# Patient Record
Sex: Male | Born: 1975 | Race: Black or African American | Hispanic: No | Marital: Married | Smoking: Never smoker
Health system: Southern US, Community
[De-identification: ages and names within clinical notes are randomized; demographics above are authoritative.]

## PROBLEM LIST (undated history)

## (undated) DIAGNOSIS — Z789 Other specified health status: Secondary | ICD-10-CM

## (undated) HISTORY — PX: NO PAST SURGERIES: SHX2092

---

## 2005-05-03 ENCOUNTER — Emergency Department (HOSPITAL_COMMUNITY): Admission: EM | Admit: 2005-05-03 | Discharge: 2005-05-03 | Payer: Self-pay | Admitting: Emergency Medicine

## 2013-11-23 ENCOUNTER — Emergency Department (HOSPITAL_COMMUNITY)
Admission: EM | Admit: 2013-11-23 | Discharge: 2013-11-23 | Disposition: A | Discharge status: Home / Self Care | Payer: No Typology Code available for payment source | Attending: Emergency Medicine | Admitting: Emergency Medicine

## 2013-11-23 ENCOUNTER — Emergency Department (HOSPITAL_COMMUNITY): Payer: No Typology Code available for payment source

## 2013-11-23 DIAGNOSIS — S299XXA Unspecified injury of thorax, initial encounter: Secondary | ICD-10-CM | POA: Insufficient documentation

## 2013-11-23 DIAGNOSIS — S199XXA Unspecified injury of neck, initial encounter: Secondary | ICD-10-CM | POA: Insufficient documentation

## 2013-11-23 DIAGNOSIS — Y9389 Activity, other specified: Secondary | ICD-10-CM | POA: Diagnosis not present

## 2013-11-23 DIAGNOSIS — R0789 Other chest pain: Secondary | ICD-10-CM

## 2013-11-23 DIAGNOSIS — Y92410 Unspecified street and highway as the place of occurrence of the external cause: Secondary | ICD-10-CM | POA: Diagnosis not present

## 2013-11-23 MED ORDER — IBUPROFEN 800 MG PO TABS: 800.0000 mg | ORAL_TABLET | Freq: Three times a day (TID) | ORAL | Status: DC

## 2013-11-23 MED ORDER — METHOCARBAMOL 500 MG PO TABS: 500.0000 mg | ORAL_TABLET | Freq: Two times a day (BID) | ORAL | Status: DC

## 2013-11-23 MED ORDER — IBUPROFEN 800 MG PO TABS
800.0000 mg | ORAL_TABLET | Freq: Once | ORAL | Status: AC
  Administered 2013-11-23: 800 mg via ORAL
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
## 2015-06-26 ENCOUNTER — Emergency Department (HOSPITAL_COMMUNITY)
Admission: EM | Admit: 2015-06-26 | Discharge: 2015-06-26 | Disposition: A | Discharge status: Home / Self Care | Payer: No Typology Code available for payment source | Attending: Emergency Medicine | Admitting: Emergency Medicine

## 2015-06-26 ENCOUNTER — Encounter (HOSPITAL_COMMUNITY): Payer: Self-pay | Admitting: Emergency Medicine

## 2015-06-26 ENCOUNTER — Emergency Department (HOSPITAL_COMMUNITY): Payer: No Typology Code available for payment source

## 2015-06-26 DIAGNOSIS — R079 Chest pain, unspecified: Secondary | ICD-10-CM

## 2015-06-26 DIAGNOSIS — Z791 Long term (current) use of non-steroidal anti-inflammatories (NSAID): Secondary | ICD-10-CM | POA: Insufficient documentation

## 2015-06-26 DIAGNOSIS — Z79899 Other long term (current) drug therapy: Secondary | ICD-10-CM | POA: Insufficient documentation

## 2015-06-26 DIAGNOSIS — R0789 Other chest pain: Secondary | ICD-10-CM | POA: Insufficient documentation

## 2015-06-26 LAB — CBC
HCT: 41.1 % (ref 39.0–52.0)
HEMOGLOBIN: 14.2 g/dL (ref 13.0–17.0)
MCH: 30.8 pg (ref 26.0–34.0)
MCHC: 34.5 g/dL (ref 30.0–36.0)
MCV: 89.2 fL (ref 78.0–100.0)
Platelets: 224 10*3/uL (ref 150–400)
RBC: 4.61 MIL/uL (ref 4.22–5.81)
RDW: 12.7 % (ref 11.5–15.5)
WBC: 3.1 10*3/uL — ABNORMAL LOW (ref 4.0–10.5)

## 2015-06-26 LAB — BASIC METABOLIC PANEL
ANION GAP: 5 (ref 5–15)
BUN: 12 mg/dL (ref 6–20)
CALCIUM: 9.3 mg/dL (ref 8.9–10.3)
CO2: 30 mmol/L (ref 22–32)
CREATININE: 0.88 mg/dL (ref 0.61–1.24)
Chloride: 105 mmol/L (ref 101–111)
GFR calc Af Amer: >60 mL/min (ref >60)
GFR calc non Af Amer: >60 mL/min (ref >60)
GLUCOSE: 86 mg/dL (ref 65–99)
Potassium: 3.4 mmol/L — ABNORMAL LOW (ref 3.5–5.1)
Sodium: 140 mmol/L (ref 135–145)

## 2015-06-26 LAB — I-STAT TROPONIN, ED
TROPONIN I, POC: 0 ng/mL (ref 0.00–0.08)
Troponin i, poc: 0 ng/mL (ref 0.00–0.08)

## 2015-06-26 MED ORDER — NAPROXEN 500 MG PO TABS: 500.0000 mg | ORAL_TABLET | Freq: Two times a day (BID) | ORAL | Status: DC

## 2015-06-26 MED ORDER — KETOROLAC TROMETHAMINE 30 MG/ML IJ SOLN
30.0000 mg | Freq: Once | INTRAMUSCULAR | Status: AC
  Administered 2015-06-26: 30 mg via INTRAMUSCULAR
  Filled 2015-06-26: qty 1
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
## 2015-06-26 NOTE — Discharge Instructions (Signed)
Take your medications as prescribed as needed for pain relief. Follow-up with your primary care provider in the next 5 days her pain has not improved. Please return to the Emergency Department if symptoms worsen or new onset of fever, new chest pain, difficulty breathing, productive cough, back pain.

## 2015-06-26 NOTE — Progress Notes (Signed)
Patient listed as having no insurance or a pcp.  EDCM spoke to patient at bedside.  Patient reports his pcp is Dr. Venida Jarvisnsei-Bonsu.  Patient reports he does not have insurance.  EDCM provided patient with list of discount pharmacies, website goodrx.com and needymeds.org for medication assistance.  [facility] provided patient with contact information for orange card, Affordable care act and Medicaid.  Patient thankful for resources.  No further EDCM needs at this time.
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
## 2017-01-22 IMAGING — CR DG CHEST 2V
2 series · 2 of 2 positions shown · non-contrast
Comparison: None.

CLINICAL DATA: Chest pain.  Upper back pain.  Symptoms for 2 days.

EXAM:
CHEST  2 VIEW

[w chest pa]
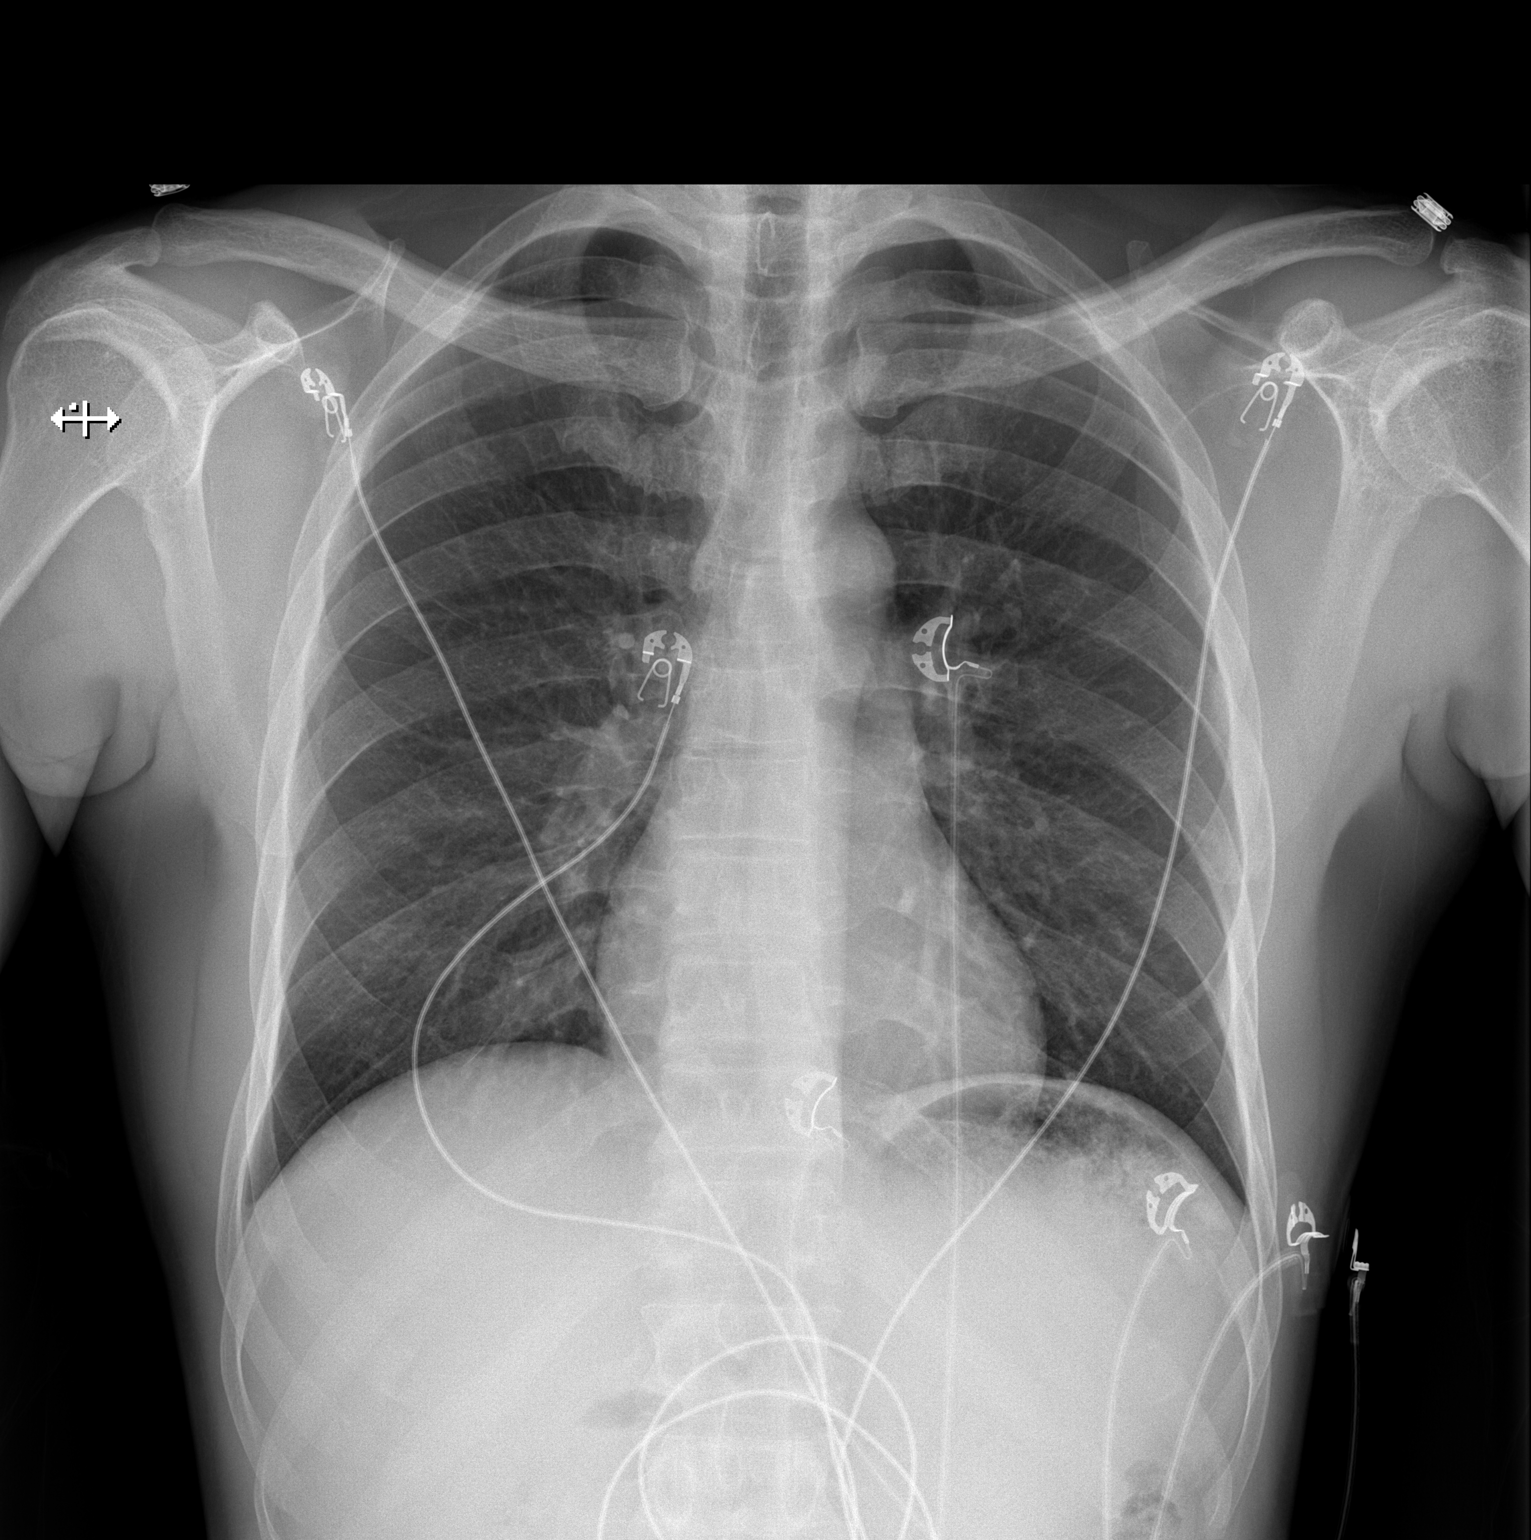

[w chest lat]
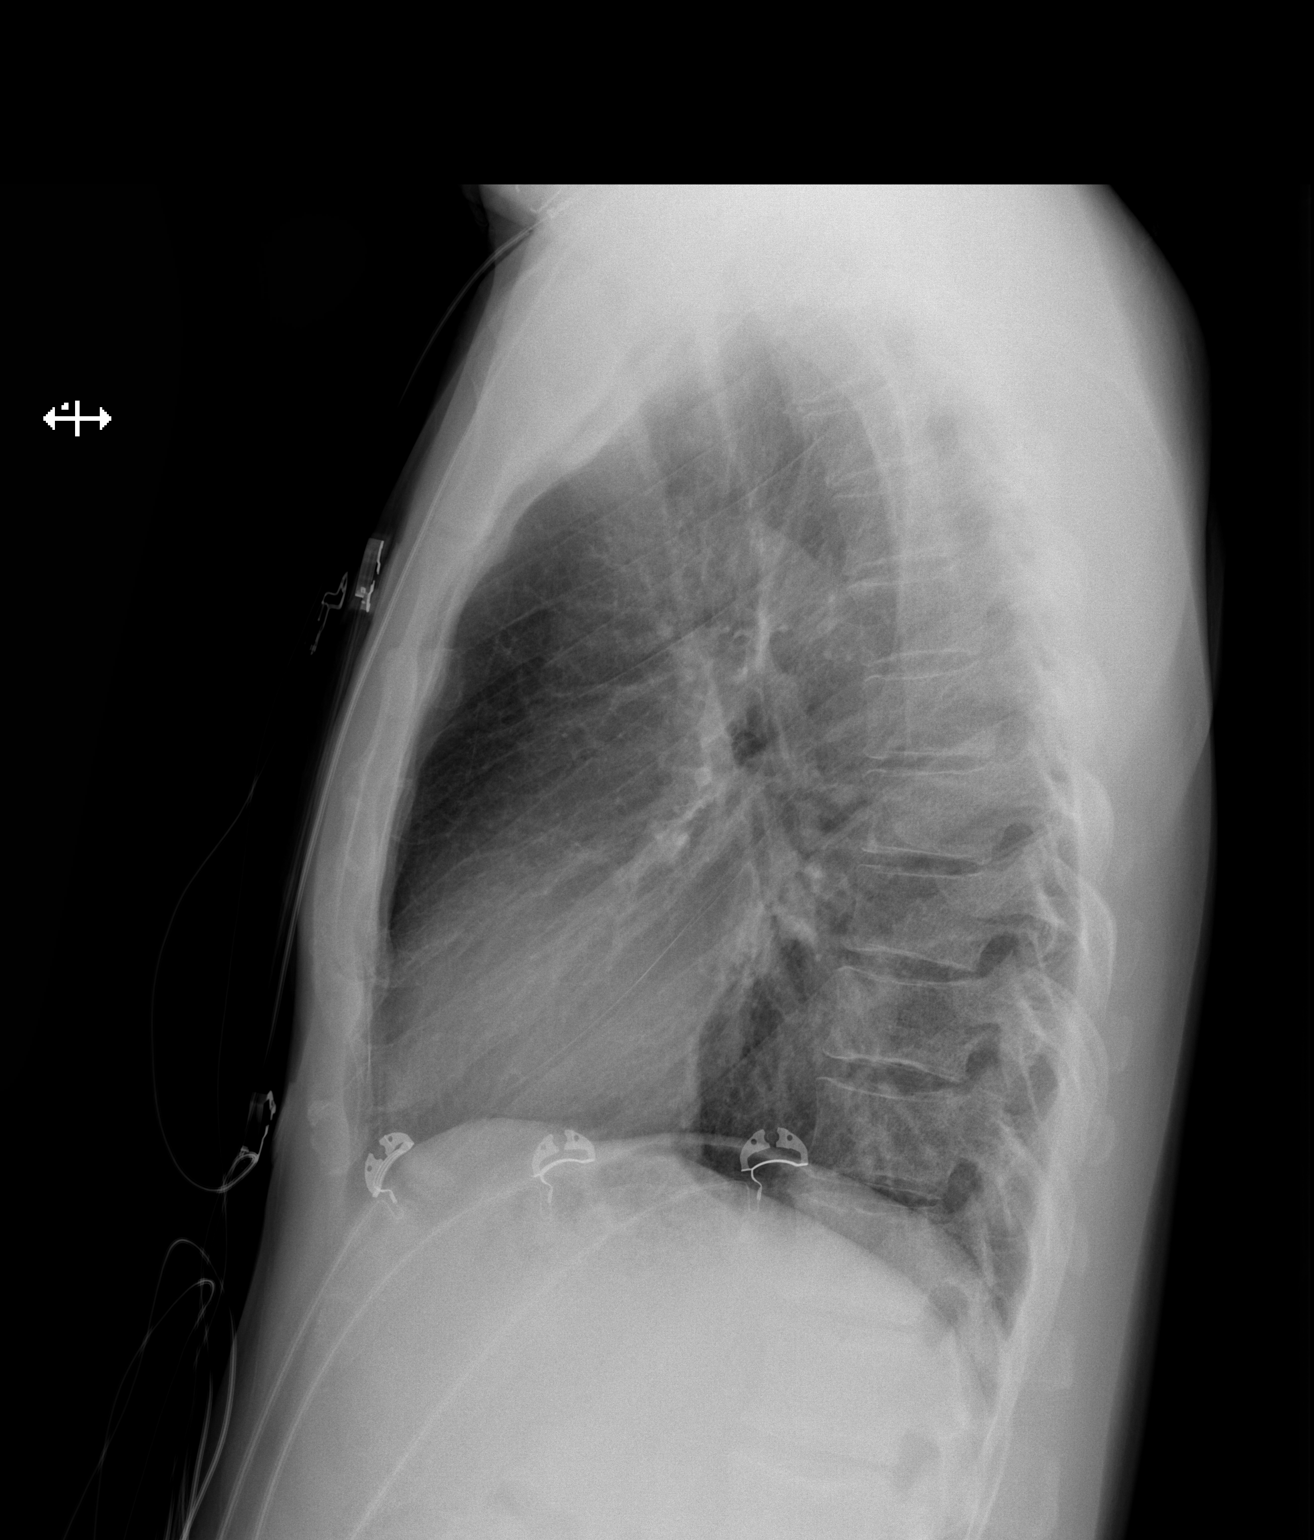

[2 of 2 positions shown; findings below may reference images not displayed]

FINDINGS: Normal heart size. Lungs clear. No pneumothorax. No pleural
effusion.
IMPRESSION: No active cardiopulmonary disease.

## 2017-11-04 ENCOUNTER — Encounter (HOSPITAL_COMMUNITY): Payer: Self-pay | Admitting: *Deleted

## 2017-11-04 ENCOUNTER — Other Ambulatory Visit: Payer: Self-pay

## 2017-11-04 ENCOUNTER — Emergency Department (HOSPITAL_COMMUNITY)
Admission: EM | Admit: 2017-11-04 | Discharge: 2017-11-04 | Disposition: A | Discharge status: Home / Self Care | Payer: No Typology Code available for payment source | Attending: Emergency Medicine | Admitting: Emergency Medicine

## 2017-11-04 DIAGNOSIS — M546 Pain in thoracic spine: Secondary | ICD-10-CM

## 2017-11-04 DIAGNOSIS — Y93I9 Activity, other involving external motion: Secondary | ICD-10-CM | POA: Insufficient documentation

## 2017-11-04 DIAGNOSIS — S299XXA Unspecified injury of thorax, initial encounter: Secondary | ICD-10-CM | POA: Diagnosis not present

## 2017-11-04 DIAGNOSIS — Y998 Other external cause status: Secondary | ICD-10-CM | POA: Diagnosis not present

## 2017-11-04 DIAGNOSIS — Y92410 Unspecified street and highway as the place of occurrence of the external cause: Secondary | ICD-10-CM | POA: Diagnosis not present

## 2017-11-04 MED ORDER — METHOCARBAMOL 500 MG PO TABS: 500.0000 mg | ORAL_TABLET | Freq: Two times a day (BID) | ORAL | 0 refills | Status: DC

## 2017-11-04 MED ORDER — NAPROXEN 500 MG PO TABS: 500.0000 mg | ORAL_TABLET | Freq: Two times a day (BID) | ORAL | 0 refills | Status: DC

## 2017-11-04 MED ORDER — NAPROXEN 500 MG PO TABS
500.0000 mg | ORAL_TABLET | Freq: Once | ORAL | Status: AC
  Administered 2017-11-04: 500 mg via ORAL
  Filled 2017-11-04: qty 1
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
## 2017-11-04 NOTE — ED Notes (Signed)
PT DISCHARGED. INSTRUCTIONS GIVEN. AAOX4. PT IN NO APPARENT DISTRESS WITH MILD PAIN. THE OPPORTUNITY TO ASK QUESTIONS WAS PROVIDED. 

## 2017-11-04 NOTE — Discharge Instructions (Signed)
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
## 2018-12-29 ENCOUNTER — Other Ambulatory Visit: Payer: Self-pay

## 2018-12-29 DIAGNOSIS — Z20822 Contact with and (suspected) exposure to covid-19: Secondary | ICD-10-CM

## 2018-12-30 LAB — NOVEL CORONAVIRUS, NAA: SARS-CoV-2, NAA: NOT DETECTED

## 2021-04-22 ENCOUNTER — Other Ambulatory Visit: Payer: Self-pay | Admitting: Otolaryngology

## 2021-04-24 ENCOUNTER — Other Ambulatory Visit: Payer: Self-pay

## 2021-04-24 ENCOUNTER — Encounter (HOSPITAL_COMMUNITY): Payer: Self-pay | Admitting: Otolaryngology

## 2021-04-24 NOTE — Progress Notes (Signed)
Spoke with pt for pre-op call. Pt denies any medical history. ? ?Shower instructions given to pt and he voiced understanding.  ?

## 2021-04-27 NOTE — Progress Notes (Signed)
left message with new arrival time of 0745 ?

## 2021-04-28 ENCOUNTER — Other Ambulatory Visit (HOSPITAL_COMMUNITY): Payer: Self-pay

## 2021-04-28 ENCOUNTER — Encounter (HOSPITAL_COMMUNITY): Payer: Self-pay | Admitting: Otolaryngology

## 2021-04-28 ENCOUNTER — Encounter (HOSPITAL_COMMUNITY)
Admission: RE | Disposition: A | Discharge status: Home / Self Care | Payer: Self-pay | Source: Home / Self Care | Attending: Otolaryngology

## 2021-04-28 ENCOUNTER — Ambulatory Visit (HOSPITAL_COMMUNITY): Payer: Commercial Managed Care - PPO | Admitting: Anesthesiology

## 2021-04-28 ENCOUNTER — Ambulatory Visit (HOSPITAL_BASED_OUTPATIENT_CLINIC_OR_DEPARTMENT_OTHER): Payer: Commercial Managed Care - PPO | Admitting: Anesthesiology

## 2021-04-28 ENCOUNTER — Ambulatory Visit (HOSPITAL_COMMUNITY)
Admission: RE | Admit: 2021-04-28 | Discharge: 2021-04-28 | Disposition: A | Discharge status: Home / Self Care | Payer: Commercial Managed Care - PPO | Attending: Otolaryngology | Admitting: Otolaryngology

## 2021-04-28 ENCOUNTER — Other Ambulatory Visit: Payer: Self-pay

## 2021-04-28 DIAGNOSIS — Z20822 Contact with and (suspected) exposure to covid-19: Secondary | ICD-10-CM | POA: Diagnosis not present

## 2021-04-28 DIAGNOSIS — R490 Dysphonia: Secondary | ICD-10-CM | POA: Diagnosis not present

## 2021-04-28 DIAGNOSIS — J381 Polyp of vocal cord and larynx: Secondary | ICD-10-CM

## 2021-04-28 HISTORY — PX: MICROLARYNGOSCOPY: SHX5208

## 2021-04-28 HISTORY — DX: Other specified health status: Z78.9

## 2021-04-28 LAB — CBC
HCT: 42.6 % (ref 39.0–52.0)
Hemoglobin: 14 g/dL (ref 13.0–17.0)
MCH: 30.8 pg (ref 26.0–34.0)
MCHC: 32.9 g/dL (ref 30.0–36.0)
MCV: 93.6 fL (ref 80.0–100.0)
Platelets: 184 10*3/uL (ref 150–400)
RBC: 4.55 MIL/uL (ref 4.22–5.81)
RDW: 12.8 % (ref 11.5–15.5)
WBC: 3.2 10*3/uL — ABNORMAL LOW (ref 4.0–10.5)
nRBC: 0 % (ref 0.0–0.2)

## 2021-04-28 LAB — SARS CORONAVIRUS 2 BY RT PCR (HOSPITAL ORDER, PERFORMED IN ~~LOC~~ HOSPITAL LAB): SARS Coronavirus 2: NEGATIVE

## 2021-04-28 SURGERY — MICROLARYNGOSCOPY
Anesthesia: General | Laterality: Left

## 2021-04-28 MED ORDER — MIDAZOLAM HCL 2 MG/2ML IJ SOLN
INTRAMUSCULAR | Status: AC
  Filled 2021-04-28: qty 2

## 2021-04-28 MED ORDER — MIDAZOLAM HCL 2 MG/2ML IJ SOLN
INTRAMUSCULAR | Status: DC | PRN
  Administered 2021-04-28: 2 mg via INTRAVENOUS

## 2021-04-28 MED ORDER — DEXMEDETOMIDINE (PRECEDEX) IN NS 20 MCG/5ML (4 MCG/ML) IV SYRINGE
PREFILLED_SYRINGE | INTRAVENOUS | Status: DC | PRN
Start: 2021-04-28 — End: 2021-04-28
  Administered 2021-04-28: 8 ug via INTRAVENOUS

## 2021-04-28 MED ORDER — LIDOCAINE VISCOUS HCL 2 % MT SOLN
15.0000 mL | Freq: Four times a day (QID) | OROMUCOSAL | 0 refills | Status: AC | PRN
  Filled 2021-04-28: qty 300, 5d supply, fill #0

## 2021-04-28 MED ORDER — PROPOFOL 10 MG/ML IV BOLUS
INTRAVENOUS | Status: DC | PRN
  Administered 2021-04-28: 150 mg via INTRAVENOUS

## 2021-04-28 MED ORDER — DOCUSATE SODIUM 100 MG PO CAPS
100.0000 mg | ORAL_CAPSULE | Freq: Two times a day (BID) | ORAL | 0 refills | Status: AC | PRN
  Filled 2021-04-28: qty 6, 3d supply, fill #0

## 2021-04-28 MED ORDER — FENTANYL CITRATE (PF) 250 MCG/5ML IJ SOLN
INTRAMUSCULAR | Status: AC
  Filled 2021-04-28: qty 5

## 2021-04-28 MED ORDER — FENTANYL CITRATE (PF) 250 MCG/5ML IJ SOLN
INTRAMUSCULAR | Status: DC | PRN
  Administered 2021-04-28: 100 ug via INTRAVENOUS

## 2021-04-28 MED ORDER — EPINEPHRINE PF 1 MG/ML IJ SOLN
INTRAMUSCULAR | Status: DC | PRN
  Administered 2021-04-28: 1 mg

## 2021-04-28 MED ORDER — PROPOFOL 500 MG/50ML IV EMUL: INTRAVENOUS | Status: DC | PRN

## 2021-04-28 MED ORDER — OXYMETAZOLINE HCL 0.05 % NA SOLN
NASAL | Status: DC | PRN
  Administered 2021-04-28: 1

## 2021-04-28 MED ORDER — HYDROCODONE-ACETAMINOPHEN 5-325 MG PO TABS
1.0000 | ORAL_TABLET | Freq: Four times a day (QID) | ORAL | 0 refills | Status: AC | PRN
  Filled 2021-04-28: qty 12, 3d supply, fill #0

## 2021-04-28 MED ORDER — DEXAMETHASONE SODIUM PHOSPHATE 10 MG/ML IJ SOLN
INTRAMUSCULAR | Status: DC | PRN
Start: 2021-04-28 — End: 2021-04-28
  Administered 2021-04-28: 10 mg via INTRAVENOUS

## 2021-04-28 MED ORDER — PROPOFOL 10 MG/ML IV BOLUS
INTRAVENOUS | Status: AC
  Filled 2021-04-28: qty 20

## 2021-04-28 MED ORDER — CHLORHEXIDINE GLUCONATE 0.12 % MT SOLN
15.0000 mL | Freq: Once | OROMUCOSAL | Status: AC
  Administered 2021-04-28: 15 mL via OROMUCOSAL
  Filled 2021-04-28: qty 15

## 2021-04-28 MED ORDER — ORAL CARE MOUTH RINSE: 15.0000 mL | Freq: Once | OROMUCOSAL | Status: AC

## 2021-04-28 MED ORDER — SUCCINYLCHOLINE 20MG/ML (10ML) SYRINGE FOR MEDFUSION PUMP - OPTIME
INTRAMUSCULAR | Status: DC | PRN
  Administered 2021-04-28: 60 mg via INTRAVENOUS

## 2021-04-28 MED ORDER — PROPOFOL 1000 MG/100ML IV EMUL
INTRAVENOUS | Status: AC
  Filled 2021-04-28: qty 100

## 2021-04-28 MED ORDER — EPINEPHRINE HCL (NASAL) 0.1 % NA SOLN
NASAL | Status: AC
  Filled 2021-04-28: qty 30

## 2021-04-28 MED ORDER — LACTATED RINGERS IV SOLN: INTRAVENOUS | Status: DC

## 2021-04-28 MED ORDER — 0.9 % SODIUM CHLORIDE (POUR BTL) OPTIME
TOPICAL | Status: DC | PRN
  Administered 2021-04-28: 1000 mL

## 2021-04-28 MED ORDER — PROPOFOL 500 MG/50ML IV EMUL
INTRAVENOUS | Status: DC | PRN
  Administered 2021-04-28: 125 ug/kg/min via INTRAVENOUS

## 2021-04-28 MED ORDER — OXYMETAZOLINE HCL 0.05 % NA SOLN
NASAL | Status: AC
  Filled 2021-04-28: qty 30

## 2021-04-28 MED ORDER — ONDANSETRON HCL 4 MG/2ML IJ SOLN
INTRAMUSCULAR | Status: DC | PRN
  Administered 2021-04-28: 4 mg via INTRAVENOUS

## 2021-04-28 MED ORDER — LIDOCAINE 2% (20 MG/ML) 5 ML SYRINGE
INTRAMUSCULAR | Status: DC | PRN
  Administered 2021-04-28: 100 mg via INTRAVENOUS
  Administered 2021-04-28: 80 mg via INTRAVENOUS

## 2021-04-28 SURGICAL SUPPLY — 34 items
BAG COUNTER SPONGE SURGICOUNT (BAG) ×2 IMPLANT
BAG SPNG CNTER NS LX DISP (BAG) ×1
BALLN PULM 12 13.5 15X75 (BALLOONS)
BALLN PULMONARY 10-12 (MISCELLANEOUS) IMPLANT
BALLOON PULM 12 13.5 15X75 (BALLOONS) IMPLANT
BLADE SURG 15 STRL LF DISP TIS (BLADE) IMPLANT
BLADE SURG 15 STRL SS (BLADE)
BNDG EYE OVAL (GAUZE/BANDAGES/DRESSINGS) ×4 IMPLANT
CANISTER SUCT 3000ML PPV (MISCELLANEOUS) ×2 IMPLANT
CNTNR URN SCR LID CUP LEK RST (MISCELLANEOUS) IMPLANT
CONT SPEC 4OZ STRL OR WHT (MISCELLANEOUS)
COVER BACK TABLE 60X90IN (DRAPES) ×2 IMPLANT
COVER MAYO STAND STRL (DRAPES) ×2 IMPLANT
DRAPE HALF SHEET 40X57 (DRAPES) ×2 IMPLANT
GAUZE SPONGE 4X4 12PLY STRL (GAUZE/BANDAGES/DRESSINGS) ×2 IMPLANT
GLOVE SURG ENC MOIS LTX SZ6.5 (GLOVE) ×2 IMPLANT
GOWN STRL REUS W/ TWL LRG LVL3 (GOWN DISPOSABLE) IMPLANT
GOWN STRL REUS W/TWL LRG LVL3 (GOWN DISPOSABLE)
KIT BASIN OR (CUSTOM PROCEDURE TRAY) ×2 IMPLANT
KIT TURNOVER KIT B (KITS) ×2 IMPLANT
NDL HYPO 25GX1X1/2 BEV (NEEDLE) IMPLANT
NEEDLE HYPO 25GX1X1/2 BEV (NEEDLE) IMPLANT
NS IRRIG 1000ML POUR BTL (IV SOLUTION) ×2 IMPLANT
PAD ARMBOARD 7.5X6 YLW CONV (MISCELLANEOUS) ×4 IMPLANT
PATTIES SURGICAL .5 X1 (DISPOSABLE) ×2 IMPLANT
PATTIES SURGICAL .5 X3 (DISPOSABLE) ×2 IMPLANT
POSITIONER HEAD DONUT 9IN (MISCELLANEOUS) IMPLANT
SOL ANTI FOG 6CC (MISCELLANEOUS) ×1 IMPLANT
SOLUTION ANTI FOG 6CC (MISCELLANEOUS) ×1
SURGILUBE 2OZ TUBE FLIPTOP (MISCELLANEOUS) IMPLANT
SUT SILK 2 0 PERMA HAND 18 BK (SUTURE) IMPLANT
TOWEL GREEN STERILE FF (TOWEL DISPOSABLE) ×2 IMPLANT
TUBE CONNECTING 12X1/4 (SUCTIONS) ×2 IMPLANT
WATER STERILE IRR 1000ML POUR (IV SOLUTION) ×2 IMPLANT

## 2021-04-28 NOTE — Op Note (Signed)
OPERATIVE NOTE ? ?Leslie Hudson Date/Time of Admission: 04/28/2021  7:27 AM  ?CSN: [id number];UPJ:[id number] Attending Provider: Cheron Schaumann A, DO ?Room/Bed: MCPO/NONE DOB: 1975/05/26 Age: 46 y.o. ? ? ?Pre-Op Diagnosis: ?Vocal cord polyp and Dysphonia ? ?Post-Op Diagnosis: ?Vocal cord polyp and Dysphonia ? ?Procedure: ?Procedure(s): ?MICROLARYNGOSCOPY WITH EXCISIONAL BIOPSY OF LEFT VOCAL FOLD LESION ? ?Anesthesia: ?General ? ?Surgeon(s): ?Weslyn Holsonback A Shealynn Saulnier, DO ? ?Staff: ?Circulator: Leonia Corona, RN ?Relief Scrub: Delorse Limber, CST ?Scrub Person: Pietro Cassis, RN ?Careers adviser: Josefa Half, RN ? ?Implants: ?* No implants in log * ? ?Specimens: ?ID Type Source Tests Collected by Time Destination  ?1 : Left true vocal cord lesion Tissue PATH Soft tissue resection SURGICAL PATHOLOGY Leslie Hudson A, DO 04/28/2021 1057   ? ? ?Complications: ?None ? ?EBL: ?<5 ML ? ?Condition: ?stable ? 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 ?Description of Operation: ?Once operative consent was obtained, and the surgical site confirmed with the operating room team, the patient was brought back to the operating room and general endotracheal anesthesia was obtained. The patient was turned over to the ENT service. An operating laryngoscope was used to directly visualize the upper airway and glottis. All anatomic areas from the oral cavity to the glottis were examined and noted to be normal with only exceptions noted in this report. Areas examined included the oropharynx, vallecula, both surfaces of the epiglottis, glottis, post cricoid region and bilateral pyriform sinuses.   The patient was placed in laryngeal suspension with the focus on the glottis with the ET tube intact.  ?An operating microscope was used to visualize the vocal cords and a left sided polyp was removed with a laryngeal scissor and sickle knife. Hemostasis  was obtained with an adrenaline soaked pledget.  ? ?An oral gastric tube was placed into the stomach and suctioned to reduce postoperative nausea. The patient was turned back over to the anesthesia service. The patient was transferred to the PACU in stable condition.  ? ? ? ?[name] A [name], DO ?[facility]  ?04/28/2021   ? ?

## 2021-04-28 NOTE — Anesthesia Procedure Notes (Signed)
Procedure Name: Intubation ?Date/Time: 04/28/2021 10:47 AM ?Performed by: Rande Brunt, CRNA ?Pre-anesthesia Checklist: Patient identified, Emergency Drugs available, Suction available and Patient being monitored ?Patient Re-evaluated:Patient Re-evaluated prior to induction ?Oxygen Delivery Method: Circle System Utilized ?Preoxygenation: Pre-oxygenation with 100% oxygen ?Induction Type: IV induction ?Ventilation: Mask ventilation without difficulty ?Laryngoscope Size: Mac and 4 ?Grade View: Grade I ?Tube type: MLT ?Tube size: 6.0 mm ?Number of attempts: 1 ?Airway Equipment and Method: Stylet and Oral airway ?Placement Confirmation: ETT inserted through vocal cords under direct vision, positive ETCO2 and breath sounds checked- equal and bilateral ?Secured at: 23 cm ?Tube secured with: Tape ?Dental Injury: Teeth and Oropharynx as per pre-operative assessment  ? ? ? ? ?

## 2021-04-28 NOTE — Transfer of Care (Signed)
Immediate Anesthesia Transfer of Care Note ? ?Patient: Leslie Hudson ? ?Procedure(s) Performed: MICROLARYNGOSCOPY WITH BIOPSY OF LEFT VOCAL FOLD LESION (Left) ? ?Patient Location: PACU ? ?Anesthesia Type:General ? ?Level of Consciousness: drowsy, patient cooperative and responds to stimulation ? ?Airway & Oxygen Therapy: Patient Spontanous Breathing and Patient connected to face mask ? ?Post-op Assessment: Report given to RN and Post -op Vital signs reviewed and stable ? ?Post vital signs: Reviewed and stable ? ?Last Vitals:  ?Vitals Value Taken Time  ?BP    ?Temp    ?Pulse    ?Resp    ?SpO2    ? ? ?Last Pain:  ?Vitals:  ? 04/28/21 0818  ?TempSrc:   ?PainSc: 0-No pain  ?   ? ?  ? ?Complications: No notable events documented. ?

## 2021-04-28 NOTE — Discharge Instructions (Signed)
[facility] POST OP INSTRUCTIONS: LARYNGOSCOPY  The Surgery Itself Laryngoscopy with biopsy or injection involves a brief general anesthesia, typically for  less than one hour. Patients may be sedated for several hours after surgery and may  remain sleepy for the better part of the day. Nausea and vomiting are occasionally seen,  and usually resolve by the evening of surgery - even without additional medications.  Almost all patients can go home the day of surgery.  After Surgery ? You will have a sore throat from the metal instruments used to allow a good view  of your voice box for 3-5 days after surgery. Some patients may have sores on  the tongue or in the mouth. This is normal and you will be given pain  medications for this. If you have sores in the mouth, avoid citrus or acidic  foods/drinks since they will burn.  ? Avoid coughing or frequent throat clearing. Drinking water can help alleviate the  urge to clear the throat. ? Avoid any heavy lifting (more than 20 lbs), straining, exercise, or sports activities  for 2 weeks after surgery. ? Avoid alcohol, tobacco products, spicy foods, or eating late at night as these may  cause heartburn or stomach reflux and may delay the healing process. ? Your voice may be hoarse after surgery from swelling of the vocal cords caused  by manipulation. ? You do not have to avoid talking unless your surgeon gives you specific  instructions. Use your normal voice since whispering is harder on your vocal  cords then talking at a regular volume. ? If your surgeon advises voice rest, you should do the following: o You are to have absolute voice rest for 7 days. You may want to purchase  a dry erase board to communicate during this time. After that, you may  begin to use your voice at a soft spoken level. o You should only speak loud enough for people to hear you that are within  an arm's length away. Do not yell or whisper. You may increase your   voice use by 5 minutes/hour each day after the first 7 days of rest.  Medications ? Pain medication can be used for pain as prescribed. Pain in the throat and the  tongue is normal. ? Some patients will be given steroids or acid reducing medications after surgery,  take these as directed. ? Take all of your routine medications as prescribed, unless told otherwise by your  surgeon. Any medications that thin the blood should be avoided unless approved  by your surgeon.  ? IT IS OK TO TAKE OVER THE COUNTER PAIN MEDICATION  (IBUPROFEN, NAPROXEN, or ACETAMINOPHEN) IN ADDITION TO  YOUR PRESCRIBED MEDICATIONS. DO NOT TAKE ASPIRIN UNLESS  CLEARED WITH YOUR SURGEON.  ? Limit Acetaminophen/Tylenol to less than 4,000mg/day  ? Limit Ibuprofen/Motrin to less than 3,600mg/day  Final Result ? Following vocal cord injection, the voice may seem "strangled" due to swelling for  a few days or weeks. This is normal.  ? If you have a biopsy in surgery, you will usually find out the pathology results  when you are seen in the office for follow up. ? Many patients benefit from voice therapy after surgery to improve the long term  result. Your surgeon will recommend this if you could benefit from it  

## 2021-04-28 NOTE — Anesthesia Postprocedure Evaluation (Signed)
Anesthesia Post Note ? ?Patient: Leslie Hudson ? ?Procedure(s) Performed: MICROLARYNGOSCOPY WITH BIOPSY OF LEFT VOCAL FOLD LESION (Left) ? ?  ? ?Patient location during evaluation: PACU ?Anesthesia Type: General ?Level of consciousness: sedated and patient cooperative ?Pain management: pain level controlled ?Vital Signs Assessment: post-procedure vital signs reviewed and stable ?Respiratory status: spontaneous breathing ?Cardiovascular status: stable ?Anesthetic complications: no ? ? ?No notable events documented. ? ?Last Vitals:  ?Vitals:  ? 04/28/21 1223 04/28/21 1224  ?BP: 118/75   ?Pulse: (!) 49 (!) 50  ?Resp: 13 13  ?Temp:  36.5 ?C  ?SpO2: 100% 100%  ?  ?Last Pain:  ?Vitals:  ? 04/28/21 1224  ?TempSrc:   ?PainSc: 0-No pain  ? ? ?  ?  ?  ?  ?  ?  ? ?Lewie Loron ? ? ? ? ?

## 2021-04-28 NOTE — H&P (Addendum)
Leslie Hudson is an 46 y.o. male.   ? ?Chief Complaint:  Hoarseness, lesion of left true vocal fold ? ?HPI: Patient presents today for planned elective procedure.  He denies any interval change in history since office visit on 03/16/2021: ? ?Leslie Hudson is a pleasant 46 y.o. male who presents as a new patient for at least four months of hoarseness. He is a Environmental education officer and states that he noticed a few months ago that his vocal quality was very hoarse. No history of thyroid dysfunction. Denies any associated throat pain, dysphagia, cough, mucous feeling in the throat, throat clearing, postnasal drip, purulent rhinorrhea, facial pressure/pain, wheezing or shortness of breath. ?No history of recurrent sinusitis, recurrent ear infections or ear surgeries. ?No PMH of stroke or heart attack. No cardiopulmonary disease, asthma, diabetes, bleeding disorder or adverse reaction to anesthesia.  ?Non-smoker. He does not drink alcohol. Minimal caffeine intake. ? ?Past Medical History:  ?Diagnosis Date  ? Medical history non-contributory   ? ? ?Past Surgical History:  ?Procedure Laterality Date  ? NO PAST SURGERIES    ? ? ?History reviewed. No pertinent family history. ? ?Social History:  reports that he has never smoked. He has never used smokeless tobacco. He reports that he does not drink alcohol and does not use drugs. ? ?Allergies: No Known Allergies ? ?No medications prior to admission.  ? ? ?Results for orders placed or performed during the hospital encounter of 04/28/21 (from the past 48 hour(s))  ?SARS Coronavirus 2 by RT PCR (hospital order, performed in [facility] hospital lab) Nasopharyngeal Nasopharyngeal Swab     Status: None  ? Collection Time: 04/28/21  7:43 AM  ? Specimen: Nasopharyngeal Swab  ?Result Value Ref Range  ? SARS Coronavirus 2 NEGATIVE NEGATIVE  ?  Comment: (NOTE) ?SARS-CoV-2 target nucleic acids are NOT DETECTED. ? ?The SARS-CoV-2 RNA is generally detectable in upper and lower ?respiratory specimens  during the acute phase of infection. The lowest ?concentration of SARS-CoV-2 viral copies this assay can detect is 250 ?copies / mL. A negative result does not preclude SARS-CoV-2 infection ?and should not be used as the sole basis for treatment or other ?patient management decisions.  A negative result may occur with ?improper specimen collection / handling, submission of specimen other ?than nasopharyngeal swab, presence of viral mutation(s) within the ?areas targeted by this assay, and inadequate number of viral copies ?(<250 copies / mL). A negative result must be combined with clinical ?observations, patient history, and epidemiological information. ? ?Fact Sheet for Patients:   ?StrictlyIdeas.no ? ?Fact Sheet for Healthcare Providers: ?BankingDealers.co.za ? ?This test is not yet approved or  cleared by the [place] FDA and ?has been authorized for detection and/or diagnosis of SARS-CoV-2 by ?FDA under an Emergency Use Authorization (EUA).  This EUA will remain ?in effect (meaning this test can be used) for the duration of the ?COVID-19 declaration under Section 564(b)(1) of the Act, 21 U.S.C. ?section 360bbb-3(b)(1), unless the authorization is terminated or ?revoked sooner. ? ?Performed at [facility], [place] [address], [place], [place] ?[address] ?  ?CBC per protocol     Status: Abnormal  ? Collection Time: 04/28/21  8:15 AM  ?Result Value Ref Range  ? WBC 3.2 (L) 4.0 - 10.5 K/uL  ? RBC 4.55 4.22 - 5.81 MIL/uL  ? Hemoglobin 14.0 13.0 - 17.0 g/dL  ? HCT 42.6 39.0 - 52.0 %  ? MCV 93.6 80.0 - 100.0 fL  ? MCH 30.8 26.0 - 34.0  pg  ? MCHC 32.9 30.0 - 36.0 g/dL  ? RDW 12.8 11.5 - 15.5 %  ? Platelets 184 150 - 400 K/uL  ? nRBC 0.0 0.0 - 0.2 %  ?  Comment: Performed at [facility], [place] [address] [place], [place] [address]  ? ?No results found. ? ?ROS: ROS ? ?Blood pressure 134/84, pulse (!) 55, temperature 97.8 ?F (36.6 ?C), temperature source Oral,  resp. rate 18, height 5\' 8"  (1.727 m), weight 72.6 kg, SpO2 100 %. ? ?PHYSICAL EXAM: ?Physical Exam ?Constitutional:   ?   Appearance: Normal appearance.  ?HENT:  ?   Head: Normocephalic.  ?   Right Ear: External ear normal.  ?   Left Ear: External ear normal.  ?   Nose: Nose normal.  ?Eyes:  ?   Extraocular Movements: Extraocular movements intact.  ?Pulmonary:  ?   Effort: Pulmonary effort is normal.  ?Musculoskeletal:  ?   Cervical back: Normal range of motion.  ?Neurological:  ?   General: No focal deficit present.  ?   Mental Status: He is oriented to person, place, and time.  ?Psychiatric:     ?   Mood and Affect: Mood normal.     ?   Behavior: Behavior normal.  ? ? ?Studies Reviewed: None ? ? ?Assessment/Plan ?Miken Desrochers is a 46 y.o. male with hoarseness and left vocal cord polyp. ?-To OR today for microlaryngoscopy with biopsy. Risks and benefits of surgery reviewed along with post-operative management and recovery. All questions and concerns addressed.  ? ? ? ?Tysean Vandervliet A Amory Simonetti ?04/28/2021, 10:01 AM ? ? ? ?

## 2021-04-28 NOTE — Anesthesia Preprocedure Evaluation (Addendum)
Anesthesia Evaluation  ?Patient identified by MRN, date of birth, ID band ?Patient awake ? ? ? ?Reviewed: ?Allergy & Precautions, NPO status , Patient's Chart, lab work & pertinent test results ? ?Airway ?Mallampati: II ? ?TM Distance: >3 FB ?Neck ROM: Full ? ? ? Dental ?no notable dental hx. ?(+) Dental Advisory Given, Teeth Intact ?  ?Pulmonary ?neg pulmonary ROS,  ?  ?Pulmonary exam normal ?breath sounds clear to auscultation ? ? ? ? ? ? Cardiovascular ?negative cardio ROS ?Normal cardiovascular exam ?Rhythm:Regular Rate:Normal ? ? ?  ?Neuro/Psych ?negative neurological ROS ?   ? GI/Hepatic ?negative GI ROS, Neg liver ROS,   ?Endo/Other  ?negative endocrine ROS ? Renal/GU ?negative Renal ROS  ? ?  ?Musculoskeletal ?negative musculoskeletal ROS ?(+)  ? Abdominal ?  ?Peds ? Hematology ?negative hematology ROS ?(+)   ?Anesthesia Other Findings ? ? Reproductive/Obstetrics ? ?  ? ? ? ? ? ? ? ? ? ? ? ? ? ?  ?  ? ? ? ? ? ? ?Anesthesia Physical ?Anesthesia Plan ? ?ASA: 1 ? ?Anesthesia Plan: General  ? ?Post-op Pain Management: Minimal or no pain anticipated  ? ?Induction: Intravenous ? ?PONV Risk Score and Plan: 2 and Ondansetron, Dexamethasone, Treatment may vary due to age or medical condition and Midazolam ? ?Airway Management Planned: Oral ETT ? ?Additional Equipment: None ? ?Intra-op Plan:  ? ?Post-operative Plan: Extubation in OR ? ?Informed Consent: I have reviewed the patients History and Physical, chart, labs and discussed the procedure including the risks, benefits and alternatives for the proposed anesthesia with the patient or authorized representative who has indicated his/her understanding and acceptance.  ? ? ? ?Dental advisory given ? ?Plan Discussed with: CRNA ? ?Anesthesia Plan Comments:   ? ? ? ? ? ?Anesthesia Quick Evaluation ? ?

## 2021-04-29 ENCOUNTER — Encounter (HOSPITAL_COMMUNITY): Payer: Self-pay | Admitting: Otolaryngology

## 2021-04-29 LAB — SURGICAL PATHOLOGY

## 2021-06-10 ENCOUNTER — Other Ambulatory Visit: Payer: Self-pay

## 2021-06-10 ENCOUNTER — Encounter (HOSPITAL_COMMUNITY): Payer: Self-pay

## 2021-06-10 ENCOUNTER — Emergency Department (HOSPITAL_COMMUNITY)
Admission: EM | Admit: 2021-06-10 | Discharge: 2021-06-10 | Disposition: A | Discharge status: Home / Self Care | Payer: Commercial Managed Care - PPO | Attending: Emergency Medicine | Admitting: Emergency Medicine

## 2021-06-10 DIAGNOSIS — R509 Fever, unspecified: Secondary | ICD-10-CM | POA: Diagnosis present

## 2021-06-10 DIAGNOSIS — J101 Influenza due to other identified influenza virus with other respiratory manifestations: Secondary | ICD-10-CM | POA: Diagnosis not present

## 2021-06-10 DIAGNOSIS — Z20822 Contact with and (suspected) exposure to covid-19: Secondary | ICD-10-CM | POA: Insufficient documentation

## 2021-06-10 LAB — RESP PANEL BY RT-PCR (FLU A&B, COVID) ARPGX2
Influenza A by PCR: POSITIVE — AB
Influenza B by PCR: NEGATIVE
SARS Coronavirus 2 by RT PCR: NEGATIVE

## 2021-06-10 LAB — GROUP A STREP BY PCR: Group A Strep by PCR: NOT DETECTED

## 2021-06-10 MED ORDER — ACETAMINOPHEN 325 MG PO TABS
650.0000 mg | ORAL_TABLET | Freq: Once | ORAL | Status: AC
  Administered 2021-06-10: 650 mg via ORAL
  Filled 2021-06-10: qty 2

## 2021-06-10 MED ORDER — DEXAMETHASONE SODIUM PHOSPHATE 10 MG/ML IJ SOLN
8.0000 mg | Freq: Once | INTRAMUSCULAR | Status: AC
Start: 2021-06-10 — End: 2021-06-10
  Administered 2021-06-10: 8 mg via INTRAMUSCULAR
  Filled 2021-06-10: qty 1

## 2021-06-10 MED ORDER — LIDOCAINE VISCOUS HCL 2 % MT SOLN
15.0000 mL | Freq: Once | OROMUCOSAL | Status: AC
Start: 2021-06-10 — End: 2021-06-10
  Administered 2021-06-10: 15 mL via OROMUCOSAL
  Filled 2021-06-10: qty 15

## 2021-06-10 NOTE — ED Provider Notes (Signed)
[facility] Provider Note   CSN: [id number] Arrival date & time: 06/10/21  1659     History  Chief Complaint  Patient presents with   Sore Throat   Fever    Leslie Hudson is a 46 y.o. male.  HPI Patient is a 46 year old male who presents to the emergency department due to sore throat, cough, rhinorrhea, body aches for the past 3 days.  States his son recently got over strep throat.  He called his PCP who prescribed him Augmentin which he has been taking twice per day for the past 2 days with no relief.  Denies any nausea, vomiting, chest pain, shortness of breath, hemoptysis.    Home Medications Prior to Admission medications   Not on File      Allergies    Patient has no known allergies.    Review of Systems   Review of Systems  Constitutional:  Positive for chills, fatigue and fever.  HENT:  Positive for congestion, rhinorrhea and sore throat.   Respiratory:  Positive for cough. Negative for shortness of breath.   Cardiovascular:  Negative for chest pain.  Gastrointestinal:  Negative for nausea and vomiting.   Physical Exam Updated Vital Signs BP 121/77   Pulse 74   Temp 99.2 F (37.3 C) (Oral)   Resp 18   Ht 5\' 8"  (1.727 m)   Wt 72.1 kg   SpO2 100%   BMI 24.18 kg/m  Physical Exam Vitals and nursing note reviewed.  Constitutional:      General: He is not in acute distress.    Appearance: Normal appearance. He is not ill-appearing, toxic-appearing or diaphoretic.  HENT:     Head: Normocephalic and atraumatic.     Right Ear: External ear normal.     Left Ear: External ear normal.     Nose: Nose normal.     Mouth/Throat:     Mouth: Mucous membranes are moist.     Pharynx: Oropharynx is clear. Posterior oropharyngeal erythema present. No oropharyngeal exudate.     Tonsils: No tonsillar exudate or tonsillar abscesses. 0 on the right. 0 on the left.     Comments: Uvula midline.  Mild erythema noted in the posterior  oropharynx.  No exudates.  No tonsillar hypertrophy.  Readily handling secretions.  No hot potato voice.  No stridor. Eyes:     Extraocular Movements: Extraocular movements intact.  Cardiovascular:     Rate and Rhythm: Normal rate.     Pulses: Normal pulses.  Pulmonary:     Effort: Pulmonary effort is normal. No respiratory distress.     Breath sounds: No stridor.  Abdominal:     General: Abdomen is flat. There is no distension.  Musculoskeletal:        General: Normal range of motion.     Cervical back: Normal range of motion and neck supple. No tenderness.  Skin:    General: Skin is warm and dry.  Neurological:     General: No focal deficit present.     Mental Status: He is alert and oriented to person, place, and time.  Psychiatric:        Mood and Affect: Mood normal.        Behavior: Behavior normal.   ED Results / Procedures / Treatments   Labs (all labs ordered are listed, but only abnormal results are displayed) Labs Reviewed  RESP PANEL BY RT-PCR (FLU A&B, COVID) ARPGX2 - Abnormal; Notable for the following components:  Result Value   Influenza A by PCR POSITIVE (*)    All other components within normal limits  GROUP A STREP BY PCR   EKG None  Radiology No results found.  Procedures Procedures   Medications Ordered in ED Medications  lidocaine (XYLOCAINE) 2 % viscous mouth solution 15 mL (15 mLs Mouth/Throat Given 06/10/21 1805)  dexamethasone (DECADRON) injection 8 mg (8 mg Intramuscular Given 06/10/21 1805)  acetaminophen (TYLENOL) tablet 650 mg (650 mg Oral Given 06/10/21 1805)    ED Course/ Medical Decision Making/ A&P Clinical Course as of 06/12/21 1312  Wed Jun 10, 2021  1934 Influenza A By PCR(!): POSITIVE [LJ]    Clinical Course User Index [LJ] Placido Sou, PA-C                           Medical Decision Making Amount and/or Complexity of Data Reviewed Labs:  Decision-making details documented in ED Course.  Risk OTC  drugs. Prescription drug management.  Pt is a 46 y.o. male who presents to the ED d/t sore throat, cough, rhinorrhea, and body aches for 3 days. His son recently had strep throat.  Labs: Strep test is negative. Respiratory panel is + for Flu A.  I, Placido Sou, PA-C, personally reviewed and evaluated these images and lab results as part of my medical decision-making.  Physical exam significant for erythema in the posterior oropharynx. No exudates. Uvula midline. Soft submental compartments. Doubt PTA or Ludwig's angina at this time. I obtained a strep test and respiratory that is positive for Flu A.  Given the length of the patient's sx did not recommend initiating Tamaflu. Recommended OTC treatments as needed for symptomatic relief. Discussed adequate hydration.   He appears stable for DC and is agreeable. Discussed return precautions. His questions were answered and he was amicable at the time of DC.  Note: Portions of this report may have been transcribed using voice recognition software. Every effort was made to ensure accuracy; however, inadvertent computerized transcription errors may be present.   Final Clinical Impression(s) / ED Diagnoses Final diagnoses:  Influenza A   Rx / DC Orders ED Discharge Orders     None         Placido Sou, PA-C 06/12/21 1316    Bethann Berkshire, MD 06/13/21 1021

## 2021-06-10 NOTE — Discharge Instructions (Signed)
Please continue to take Tylenol as well as Motrin for your sore throat and fevers.  Please follow the instructions on the bottles.  I would also recommend salt water gargles.  Warm tea with honey.  Please continue to monitor your symptoms closely and return to the emergency department any new or worsening symptoms.

## 2021-06-10 NOTE — ED Triage Notes (Signed)
Pt c/o sore throat and fever for the last few days. Pt states his son is getting over strept throat.
